# Patient Record
Sex: Male | Born: 1985 | Race: White | Hispanic: No | Marital: Single | Smoking: Former smoker
Health system: Southern US, Community
[De-identification: ages and names within clinical notes are randomized; demographics above are authoritative.]

## PROBLEM LIST (undated history)

## (undated) DIAGNOSIS — T7840XA Allergy, unspecified, initial encounter: Secondary | ICD-10-CM

## (undated) HISTORY — DX: Allergy, unspecified, initial encounter: T78.40XA

---

## 2007-05-22 ENCOUNTER — Emergency Department: Payer: Self-pay | Admitting: Emergency Medicine

## 2007-05-22 ENCOUNTER — Inpatient Hospital Stay (HOSPITAL_COMMUNITY): Admission: EM | Admit: 2007-05-22 | Discharge: 2007-05-23 | Payer: Self-pay | Admitting: Emergency Medicine

## 2007-05-29 ENCOUNTER — Emergency Department: Payer: Self-pay | Admitting: Emergency Medicine

## 2007-06-01 ENCOUNTER — Ambulatory Visit (HOSPITAL_COMMUNITY): Admission: RE | Admit: 2007-06-01 | Discharge: 2007-06-01 | Payer: Self-pay | Admitting: Otolaryngology

## 2007-06-16 ENCOUNTER — Ambulatory Visit (HOSPITAL_COMMUNITY): Admission: RE | Admit: 2007-06-16 | Discharge: 2007-06-16 | Payer: Self-pay | Admitting: Otolaryngology

## 2007-07-04 ENCOUNTER — Emergency Department: Payer: Self-pay

## 2007-07-27 ENCOUNTER — Ambulatory Visit (HOSPITAL_COMMUNITY): Admission: RE | Admit: 2007-07-27 | Discharge: 2007-07-27 | Payer: Self-pay | Admitting: Otolaryngology

## 2007-07-30 ENCOUNTER — Ambulatory Visit (HOSPITAL_BASED_OUTPATIENT_CLINIC_OR_DEPARTMENT_OTHER): Admission: RE | Admit: 2007-07-30 | Discharge: 2007-07-30 | Payer: Self-pay | Admitting: Otolaryngology
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
## 2010-08-16 ENCOUNTER — Emergency Department: Payer: Self-pay | Admitting: Internal Medicine

## 2010-10-12 LAB — POCT HEMOGLOBIN-HEMACUE: Hemoglobin: 17

## 2011-10-31 LAB — DRUG SCREEN, URINE
Benzodiazepine, Ur Scrn: NEGATIVE (ref ?–200)
Cannabinoid 50 Ng, Ur ~~LOC~~: NEGATIVE (ref ?–50)
MDMA (Ecstasy)Ur Screen: NEGATIVE (ref ?–500)
Opiate, Ur Screen: NEGATIVE (ref ?–300)
Phencyclidine (PCP) Ur S: NEGATIVE (ref ?–25)

## 2011-10-31 LAB — COMPREHENSIVE METABOLIC PANEL
Anion Gap: 14 (ref 7–16)
Calcium, Total: 9.3 mg/dL (ref 8.5–10.1)
Chloride: 100 mmol/L (ref 98–107)
Co2: 26 mmol/L (ref 21–32)
Creatinine: 1.39 mg/dL — ABNORMAL HIGH (ref 0.60–1.30)
EGFR (African American): >60
Osmolality: 279 (ref 275–301)
SGPT (ALT): 36 U/L (ref 12–78)
Sodium: 140 mmol/L (ref 136–145)
Total Protein: 8.1 g/dL (ref 6.4–8.2)

## 2011-10-31 LAB — URINALYSIS, COMPLETE
Glucose,UR: NEGATIVE mg/dL (ref 0–75)
Nitrite: NEGATIVE
Protein: NEGATIVE
RBC,UR: 1 /HPF (ref 0–5)
WBC UR: <1 /HPF (ref 0–5)

## 2011-10-31 LAB — CBC
HGB: 17 g/dL (ref 13.0–18.0)
MCH: 32.8 pg (ref 26.0–34.0)
MCV: 96 fL (ref 80–100)

## 2011-11-01 ENCOUNTER — Inpatient Hospital Stay: Payer: Self-pay | Admitting: Psychiatry

## 2012-10-20 IMAGING — CT CT HEAD WITHOUT CONTRAST
2 series · 16 of 30 positions shown, 20 images · non-contrast
Comparison: none

REASON FOR EXAM: confusion
COMMENTS:

[Series 2: without · axial · non-contrast · 0.45mm/px · z∈[-42,+78]mm · 13 of 30 slices shown, 17 images]
[im 3/30  brain]
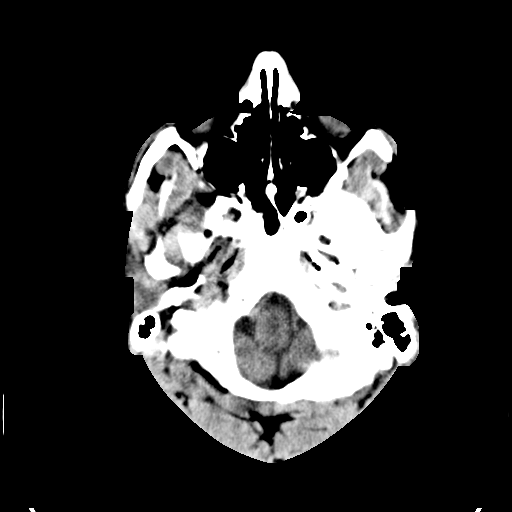
[im 3/30  bone]
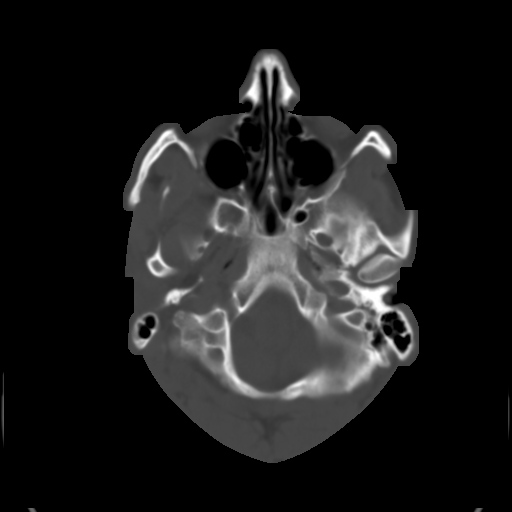
[im 5/30  brain]
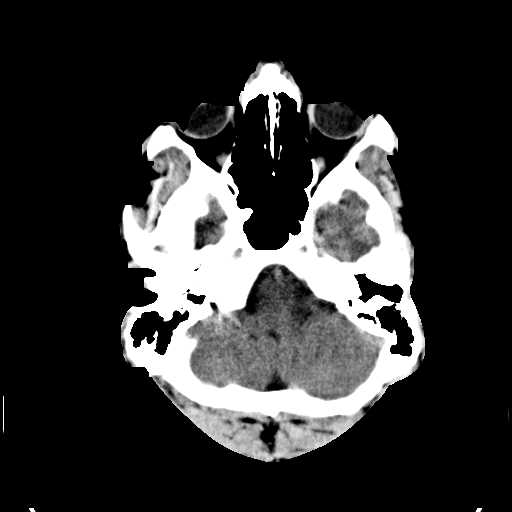
[im 7/30  brain]
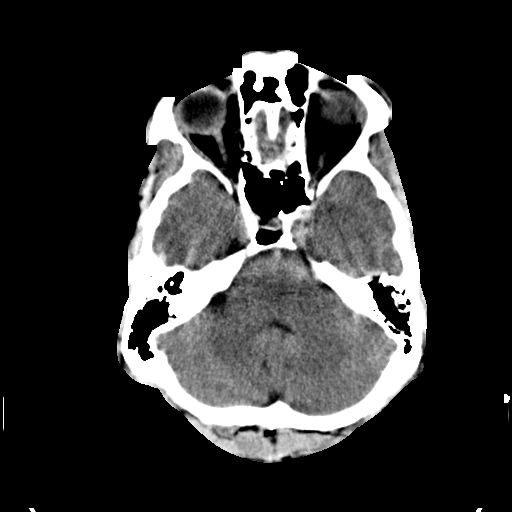
[im 9/30  brain]
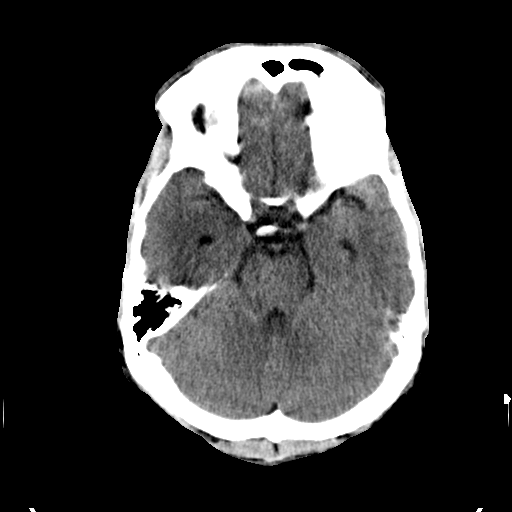
[im 11/30  brain]
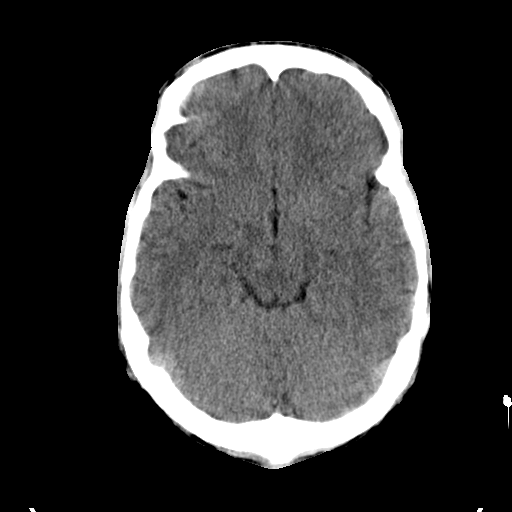
[im 11/30  bone]
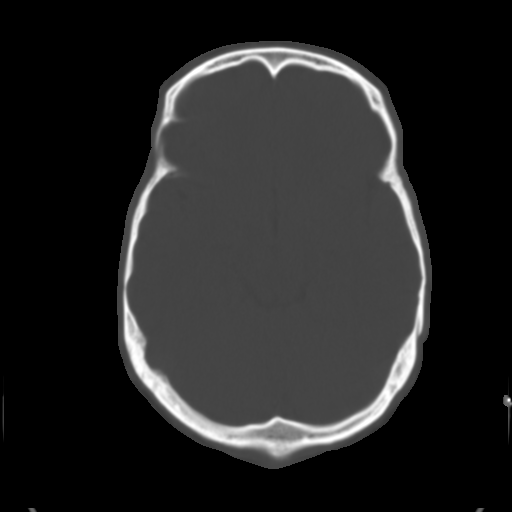
[im 13/30  brain]
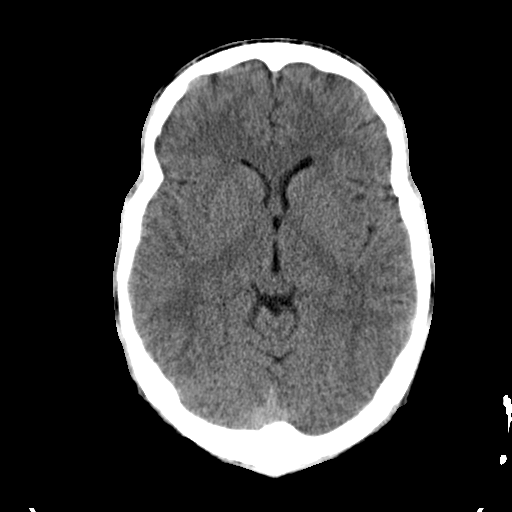
[im 15/30  brain]
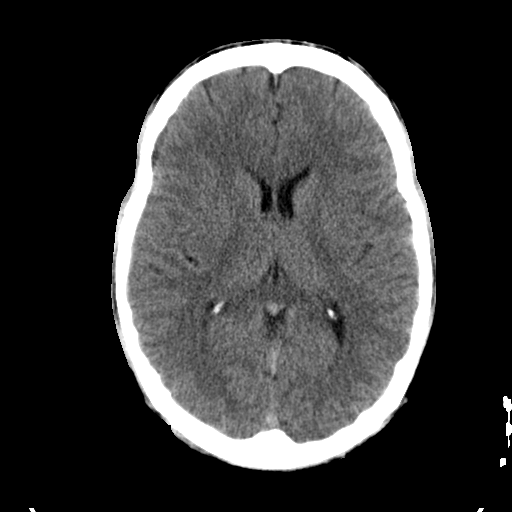
[im 17/30  brain]
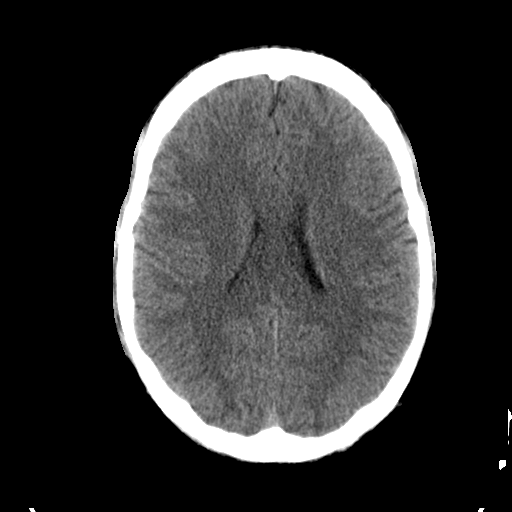
[im 19/30  brain]
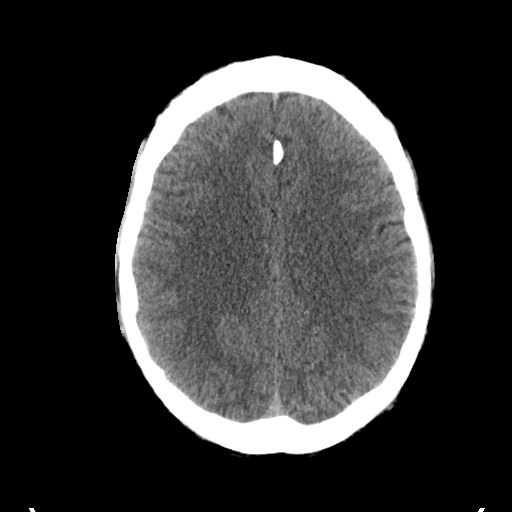
[im 19/30  bone]
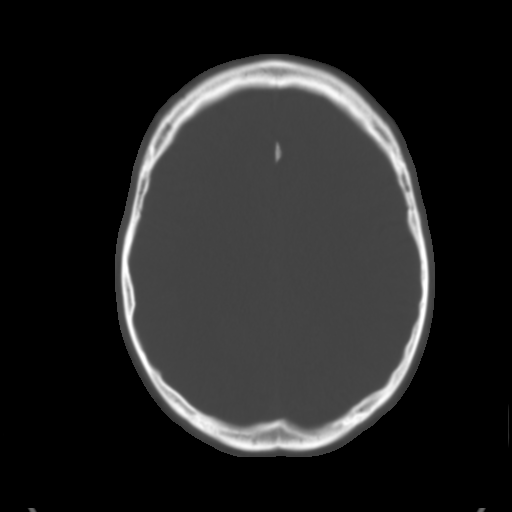
[im 21/30  brain]
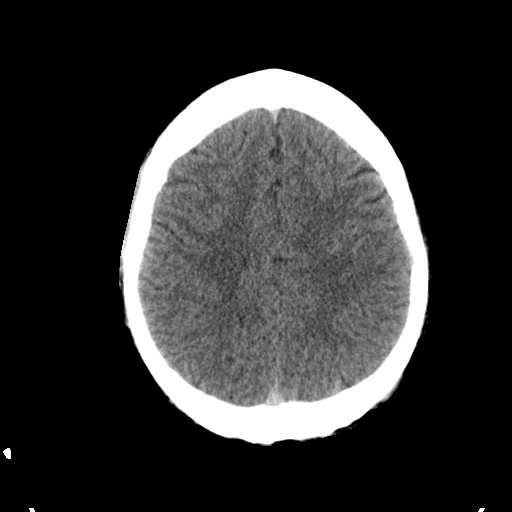
[im 23/30  brain]
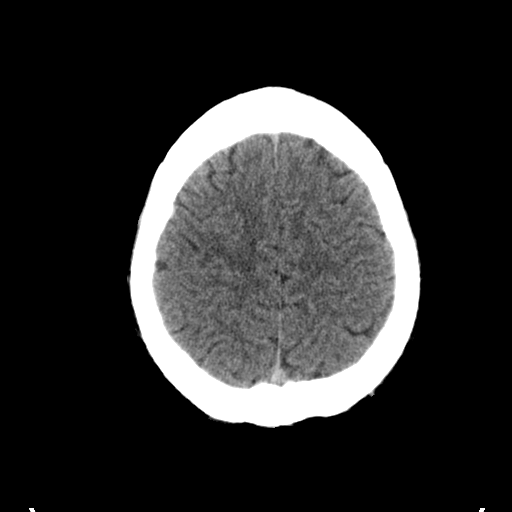
[im 25/30  brain]
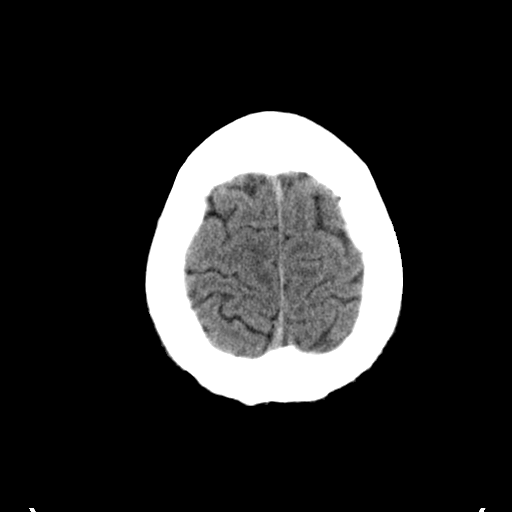
[im 27/30  brain]
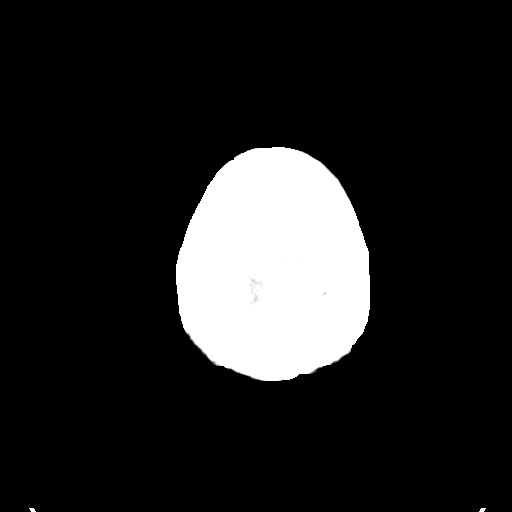
[im 27/30  bone]
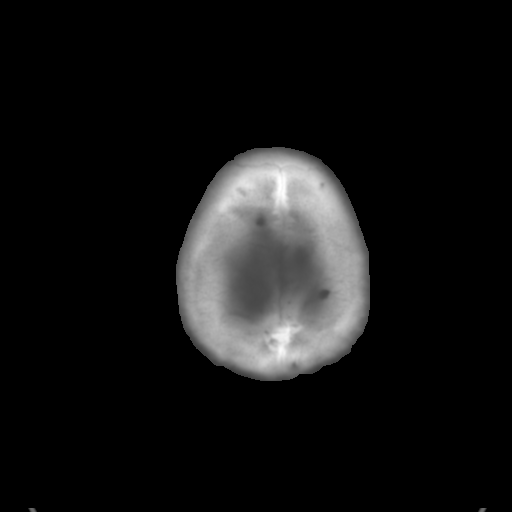

[Series 3: bone · axial · 0.45mm/px · z∈[-42,-2]mm · 3 of 30 slices shown]
[im 3/30  bone]
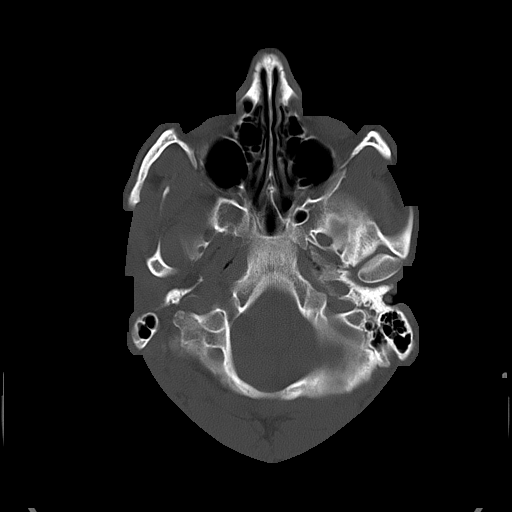
[im 7/30  bone]
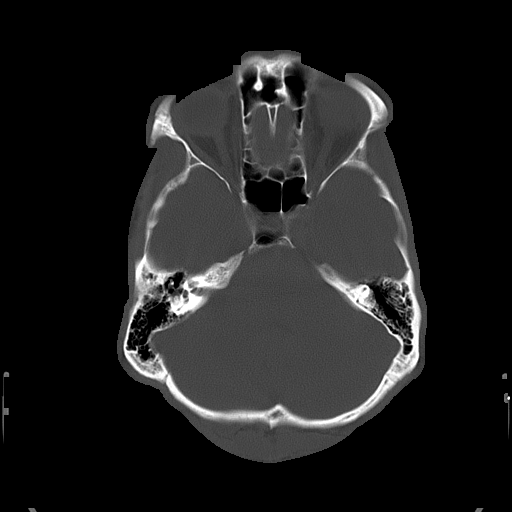
[im 11/30  bone]
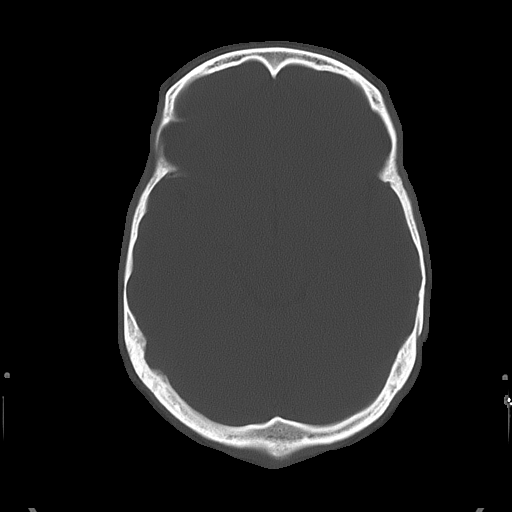

[16 of 30 positions shown; findings below may reference images not displayed]

PROCEDURE:     CT  - CT HEAD WITHOUT CONTRAST  - August 16, 2010  [DATE]

RESULT:     Emergent noncontrast CT of the brain is compared to the study of
05/22/2007.

The ventricles and sulci are normal. There is no hemorrhage. There is no
focal mass, mass-effect or midline shift. There is no evidence of edema or
territorial infarct. The bone windows demonstrate normal aeration of the
paranasal sinuses and mastoid air cells. There is no skull fracture
demonstrated.
IMPRESSION: 1. No acute intracranial abnormality. Stable appearance.
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
## 2015-05-29 ENCOUNTER — Ambulatory Visit (INDEPENDENT_AMBULATORY_CARE_PROVIDER_SITE_OTHER): Payer: Self-pay | Admitting: Family Medicine

## 2015-05-29 ENCOUNTER — Encounter: Payer: Self-pay | Admitting: Family Medicine

## 2015-05-29 VITALS — BP 108/82 | HR 66 | Resp 16 | Ht 72.0 in | Wt 213.0 lb

## 2015-05-29 DIAGNOSIS — J309 Allergic rhinitis, unspecified: Secondary | ICD-10-CM

## 2015-05-29 DIAGNOSIS — J40 Bronchitis, not specified as acute or chronic: Secondary | ICD-10-CM

## 2015-05-29 MED ORDER — AZITHROMYCIN 250 MG PO TABS: ORAL_TABLET | ORAL | Status: AC

## 2015-05-29 NOTE — Progress Notes (Signed)
Date:  05/29/2015   Name:  Steven Avila   DOB:  [birth year]   MRN:  [id number]  PCP:  No primary care provider on file.    Chief Complaint: Sore Throat; Fever; and Cough   History of Present Illness:  This is a 30 y.o. male with 2-3 day hx of cough productive clear phlegm, nasal congestion, B chest wall pain from coughing, ears full, general malaise. Chills yesterday, mild myalgias initially but none now, no known exposures. Takes Zyrtec daily for allergies, otherwise healthy. Mother with RA, father and sister healthy. Last tetanus imm 1.5 yrs ago.  Review of Systems:  Review of Systems  HENT: Negative for sinus pressure and trouble swallowing.   Eyes: Negative for pain.  Respiratory: Negative for shortness of breath.   Cardiovascular: Negative for leg swelling.  Gastrointestinal: Negative for abdominal pain.  Neurological: Negative for syncope and light-headedness.    Patient Active Problem List   Diagnosis Date Noted  . Allergic rhinitis 05/29/2015    Prior to Admission medications   Medication Sig Start Date End Date Taking? Authorizing Provider  cetirizine (ZYRTEC) 10 MG tablet Take 10 mg by mouth daily.   Yes Historical Provider, MD  Dextromethorphan HBr (COUGH RELIEF ADULT PO) Take by mouth.   Yes Historical Provider, MD  azithromycin (ZITHROMAX) 250 MG tablet Take two tablets today then one tablet daily for next four days 05/29/15   Schuyler AmorWilliam Jennine Peddy, MD    No Known Allergies  History reviewed. No pertinent past surgical history.  Social History  Substance Use Topics  . Smoking status: Former Games developermoker  . Smokeless tobacco: Never Used  . Alcohol Use: No    Family History  Problem Relation Age of Onset  . Family history unknown: Yes    Medication list has been reviewed and updated.  Physical Examination: BP 108/82 mmHg  Pulse 66  Resp 16  Ht 6' (1.829 m)  Wt 213 lb (96.616 kg)  BMI 28.88 kg/m2  SpO2 93%  Physical Exam  Constitutional: He is oriented  to person, place, and time. He appears well-developed and well-nourished.  HENT:  Head: Normocephalic and atraumatic.  Right Ear: External ear normal.  Left Ear: External ear normal.  Nose: Nose normal.  Mouth/Throat: Oropharynx is clear and moist. No oropharyngeal exudate.  R TM normal, L TM obscured by cerumen  Eyes: Conjunctivae and EOM are normal. Pupils are equal, round, and reactive to light.  Neck: Neck supple. No thyromegaly present.  Cardiovascular: Normal rate, regular rhythm and normal heart sounds.   Pulmonary/Chest: Effort normal and breath sounds normal. He has no wheezes. He has no rales.  B lateral chest wall slightly tender  Abdominal: Soft. He exhibits no distension and no mass. There is no tenderness.  Musculoskeletal: He exhibits no edema.  Lymphadenopathy:    He has no cervical adenopathy.  Neurological: He is alert and oriented to person, place, and time. Coordination normal.  Skin: Skin is warm and dry.  Psychiatric: He has a normal mood and affect. His behavior is normal.  Nursing note and vitals reviewed.   Assessment and Plan:  1. Bronchitis Likely viral but with persistent low grade fever and relatively low pulse ox will cover with Zpak  2. Allergic rhinitis, unspecified allergic rhinitis type Well controlled on Zyrtec  Return if symptoms worsen or fail to improve.  Dionne AnoWilliam M. Kingsley SpittlePlonk, Jr. MD [facility]  05/29/2015
